# Patient Record
Sex: Female | Born: 1965 | Race: Black or African American | Hispanic: No | Smoking: Never smoker
Health system: Southern US, Community
[De-identification: ages and names within clinical notes are randomized; demographics above are authoritative.]

## PROBLEM LIST (undated history)

## (undated) DIAGNOSIS — I10 Essential (primary) hypertension: Secondary | ICD-10-CM

## (undated) HISTORY — DX: Essential (primary) hypertension: I10

---

## 2004-10-28 ENCOUNTER — Emergency Department (HOSPITAL_COMMUNITY): Admission: EM | Admit: 2004-10-28 | Discharge: 2004-10-28 | Payer: Self-pay | Admitting: Family Medicine

## 2004-12-15 ENCOUNTER — Emergency Department (HOSPITAL_COMMUNITY): Admission: EM | Admit: 2004-12-15 | Discharge: 2004-12-15 | Payer: Self-pay | Admitting: Family Medicine

## 2004-12-31 ENCOUNTER — Ambulatory Visit: Payer: Self-pay | Admitting: Internal Medicine

## 2005-03-18 ENCOUNTER — Other Ambulatory Visit: Admission: RE | Admit: 2005-03-18 | Discharge: 2005-03-18 | Payer: Self-pay | Admitting: Family Medicine

## 2005-03-18 ENCOUNTER — Ambulatory Visit: Payer: Self-pay | Admitting: Family Medicine

## 2005-06-14 ENCOUNTER — Emergency Department (HOSPITAL_COMMUNITY): Admission: EM | Admit: 2005-06-14 | Discharge: 2005-06-14 | Payer: Self-pay | Admitting: Family Medicine

## 2006-01-27 ENCOUNTER — Other Ambulatory Visit: Admission: RE | Admit: 2006-01-27 | Discharge: 2006-01-27 | Payer: Self-pay | Admitting: Obstetrics and Gynecology

## 2006-08-10 ENCOUNTER — Emergency Department (HOSPITAL_COMMUNITY): Admission: EM | Admit: 2006-08-10 | Discharge: 2006-08-11 | Payer: Self-pay | Admitting: Emergency Medicine

## 2006-12-09 ENCOUNTER — Emergency Department (HOSPITAL_COMMUNITY): Admission: EM | Admit: 2006-12-09 | Discharge: 2006-12-09 | Payer: Self-pay | Admitting: Family Medicine

## 2007-05-12 ENCOUNTER — Ambulatory Visit: Payer: Self-pay | Admitting: Family Medicine

## 2007-05-13 ENCOUNTER — Ambulatory Visit: Payer: Self-pay | Admitting: *Deleted

## 2007-06-29 ENCOUNTER — Telehealth (INDEPENDENT_AMBULATORY_CARE_PROVIDER_SITE_OTHER): Payer: Self-pay | Admitting: Internal Medicine

## 2007-07-14 ENCOUNTER — Encounter (INDEPENDENT_AMBULATORY_CARE_PROVIDER_SITE_OTHER): Payer: Self-pay | Admitting: *Deleted

## 2007-11-22 ENCOUNTER — Ambulatory Visit: Payer: Self-pay | Admitting: Nurse Practitioner

## 2007-11-22 DIAGNOSIS — I10 Essential (primary) hypertension: Secondary | ICD-10-CM | POA: Insufficient documentation

## 2007-11-22 DIAGNOSIS — K029 Dental caries, unspecified: Secondary | ICD-10-CM | POA: Insufficient documentation

## 2007-11-23 ENCOUNTER — Encounter (INDEPENDENT_AMBULATORY_CARE_PROVIDER_SITE_OTHER): Payer: Self-pay | Admitting: Nurse Practitioner

## 2007-12-23 ENCOUNTER — Ambulatory Visit: Payer: Self-pay | Admitting: Obstetrics & Gynecology

## 2007-12-24 ENCOUNTER — Ambulatory Visit: Payer: Self-pay | Admitting: Nurse Practitioner

## 2007-12-24 DIAGNOSIS — L259 Unspecified contact dermatitis, unspecified cause: Secondary | ICD-10-CM | POA: Insufficient documentation

## 2007-12-31 ENCOUNTER — Ambulatory Visit (HOSPITAL_COMMUNITY): Admission: RE | Admit: 2007-12-31 | Discharge: 2007-12-31 | Payer: Self-pay | Admitting: Internal Medicine

## 2008-01-13 ENCOUNTER — Encounter (INDEPENDENT_AMBULATORY_CARE_PROVIDER_SITE_OTHER): Payer: Self-pay | Admitting: Internal Medicine

## 2008-01-21 ENCOUNTER — Encounter: Admission: RE | Admit: 2008-01-21 | Discharge: 2008-01-21 | Payer: Self-pay | Admitting: Internal Medicine

## 2008-03-10 ENCOUNTER — Telehealth (INDEPENDENT_AMBULATORY_CARE_PROVIDER_SITE_OTHER): Payer: Self-pay | Admitting: Nurse Practitioner

## 2008-03-21 ENCOUNTER — Encounter (INDEPENDENT_AMBULATORY_CARE_PROVIDER_SITE_OTHER): Payer: Self-pay | Admitting: *Deleted

## 2008-03-31 ENCOUNTER — Ambulatory Visit: Payer: Self-pay | Admitting: Nurse Practitioner

## 2008-03-31 DIAGNOSIS — D649 Anemia, unspecified: Secondary | ICD-10-CM | POA: Insufficient documentation

## 2008-03-31 LAB — CONVERTED CEMR LAB
Basophils Absolute: 0 10*3/uL (ref 0.0–0.1)
Bilirubin Urine: NEGATIVE
Eosinophils Absolute: 0.2 10*3/uL (ref 0.0–0.7)
Eosinophils Relative: 3 % (ref 0–5)
GC Probe Amp, Genital: NEGATIVE
HCT: 34.3 % — ABNORMAL LOW (ref 36.0–46.0)
Hemoglobin: 11 g/dL — ABNORMAL LOW (ref 12.0–15.0)
Lymphocytes Relative: 40 % (ref 12–46)
Lymphs Abs: 1.9 10*3/uL (ref 0.7–4.0)
MCV: 89.1 fL (ref 78.0–100.0)
Monocytes Absolute: 0.4 10*3/uL (ref 0.1–1.0)
Pap Smear: NEGATIVE
Platelets: 313 10*3/uL (ref 150–400)
RDW: 13.7 % (ref 11.5–15.5)
Specific Gravity, Urine: 1.01
Urobilinogen, UA: 1
WBC Urine, dipstick: NEGATIVE

## 2008-04-01 ENCOUNTER — Encounter (INDEPENDENT_AMBULATORY_CARE_PROVIDER_SITE_OTHER): Payer: Self-pay | Admitting: Nurse Practitioner

## 2008-04-05 ENCOUNTER — Telehealth (INDEPENDENT_AMBULATORY_CARE_PROVIDER_SITE_OTHER): Payer: Self-pay | Admitting: Nurse Practitioner

## 2008-04-05 ENCOUNTER — Encounter (INDEPENDENT_AMBULATORY_CARE_PROVIDER_SITE_OTHER): Payer: Self-pay | Admitting: Nurse Practitioner

## 2008-04-06 ENCOUNTER — Telehealth (INDEPENDENT_AMBULATORY_CARE_PROVIDER_SITE_OTHER): Payer: Self-pay | Admitting: Nurse Practitioner

## 2008-05-29 ENCOUNTER — Telehealth (INDEPENDENT_AMBULATORY_CARE_PROVIDER_SITE_OTHER): Payer: Self-pay | Admitting: Nurse Practitioner

## 2008-06-30 ENCOUNTER — Ambulatory Visit: Payer: Self-pay | Admitting: Nurse Practitioner

## 2008-06-30 LAB — CONVERTED CEMR LAB
Basophils Absolute: 0 10*3/uL (ref 0.0–0.1)
Basophils Relative: 0 % (ref 0–1)
Eosinophils Relative: 2 % (ref 0–5)
HCT: 35.8 % — ABNORMAL LOW (ref 36.0–46.0)
Hemoglobin: 11.4 g/dL — ABNORMAL LOW (ref 12.0–15.0)
MCHC: 31.8 g/dL (ref 30.0–36.0)
Monocytes Absolute: 0.4 10*3/uL (ref 0.1–1.0)
Platelets: 223 10*3/uL (ref 150–400)
RDW: 13.9 % (ref 11.5–15.5)

## 2008-07-04 ENCOUNTER — Encounter (INDEPENDENT_AMBULATORY_CARE_PROVIDER_SITE_OTHER): Payer: Self-pay | Admitting: Nurse Practitioner

## 2008-09-15 ENCOUNTER — Ambulatory Visit: Payer: Self-pay | Admitting: Nurse Practitioner

## 2008-09-15 DIAGNOSIS — R599 Enlarged lymph nodes, unspecified: Secondary | ICD-10-CM | POA: Insufficient documentation

## 2009-05-02 ENCOUNTER — Encounter (INDEPENDENT_AMBULATORY_CARE_PROVIDER_SITE_OTHER): Payer: Self-pay | Admitting: Nurse Practitioner

## 2009-05-02 ENCOUNTER — Ambulatory Visit: Payer: Self-pay | Admitting: Nurse Practitioner

## 2009-05-02 LAB — CONVERTED CEMR LAB
Glucose, Urine, Semiquant: NEGATIVE
Nitrite: NEGATIVE
Specific Gravity, Urine: <1.005
WBC Urine, dipstick: NEGATIVE
pH: 6

## 2009-05-03 ENCOUNTER — Encounter (INDEPENDENT_AMBULATORY_CARE_PROVIDER_SITE_OTHER): Payer: Self-pay | Admitting: Nurse Practitioner

## 2009-05-03 DIAGNOSIS — E0590 Thyrotoxicosis, unspecified without thyrotoxic crisis or storm: Secondary | ICD-10-CM | POA: Insufficient documentation

## 2009-05-03 LAB — CONVERTED CEMR LAB
Albumin: 4.4 g/dL (ref 3.5–5.2)
Alkaline Phosphatase: 51 units/L (ref 39–117)
BUN: 15 mg/dL (ref 6–23)
Basophils Absolute: 0 10*3/uL (ref 0.0–0.1)
Basophils Relative: 0 % (ref 0–1)
Calcium: 9.8 mg/dL (ref 8.4–10.5)
Creatinine, Ser: 0.93 mg/dL (ref 0.40–1.20)
Eosinophils Absolute: 0.1 10*3/uL (ref 0.0–0.7)
Eosinophils Relative: 3 % (ref 0–5)
Free T4: 0.99 ng/dL (ref 0.80–1.80)
Glucose, Bld: 105 mg/dL — ABNORMAL HIGH (ref 70–99)
HCT: 34.8 % — ABNORMAL LOW (ref 36.0–46.0)
HDL: 53 mg/dL (ref >39)
Hemoglobin: 11.4 g/dL — ABNORMAL LOW (ref 12.0–15.0)
LDL Cholesterol: 71 mg/dL (ref 0–99)
Lymphocytes Relative: 37 % (ref 12–46)
MCHC: 32.8 g/dL (ref 30.0–36.0)
MCV: 93 fL (ref 78.0–100.0)
Monocytes Absolute: 0.3 10*3/uL (ref 0.1–1.0)
Potassium: 3.9 meq/L (ref 3.5–5.3)
RDW: 13 % (ref 11.5–15.5)
T3, Free: 2.5 pg/mL (ref 2.3–4.2)
Triglycerides: 30 mg/dL (ref <150)

## 2009-05-07 ENCOUNTER — Ambulatory Visit (HOSPITAL_COMMUNITY): Admission: RE | Admit: 2009-05-07 | Discharge: 2009-05-07 | Payer: Self-pay | Admitting: Internal Medicine

## 2009-05-17 ENCOUNTER — Telehealth (INDEPENDENT_AMBULATORY_CARE_PROVIDER_SITE_OTHER): Payer: Self-pay | Admitting: *Deleted

## 2009-05-17 ENCOUNTER — Ambulatory Visit: Payer: Self-pay | Admitting: Nurse Practitioner

## 2009-05-31 ENCOUNTER — Ambulatory Visit: Payer: Self-pay | Admitting: Nurse Practitioner

## 2009-11-01 ENCOUNTER — Telehealth (INDEPENDENT_AMBULATORY_CARE_PROVIDER_SITE_OTHER): Payer: Self-pay | Admitting: Nurse Practitioner

## 2009-12-21 ENCOUNTER — Ambulatory Visit: Payer: Self-pay | Admitting: Nurse Practitioner

## 2010-01-16 ENCOUNTER — Ambulatory Visit: Payer: Self-pay | Admitting: Nurse Practitioner

## 2010-02-27 ENCOUNTER — Ambulatory Visit: Payer: Self-pay | Admitting: Nurse Practitioner

## 2010-02-27 DIAGNOSIS — N898 Other specified noninflammatory disorders of vagina: Secondary | ICD-10-CM | POA: Insufficient documentation

## 2010-02-27 LAB — CONVERTED CEMR LAB
Bilirubin Urine: NEGATIVE
KOH Prep: NEGATIVE
Ketones, urine, test strip: NEGATIVE
Specific Gravity, Urine: 1.02

## 2010-06-06 ENCOUNTER — Encounter (INDEPENDENT_AMBULATORY_CARE_PROVIDER_SITE_OTHER): Payer: Self-pay | Admitting: Nurse Practitioner

## 2010-06-10 ENCOUNTER — Encounter (INDEPENDENT_AMBULATORY_CARE_PROVIDER_SITE_OTHER): Payer: Self-pay | Admitting: Nurse Practitioner

## 2010-06-12 ENCOUNTER — Encounter (INDEPENDENT_AMBULATORY_CARE_PROVIDER_SITE_OTHER): Payer: Self-pay | Admitting: Nurse Practitioner

## 2010-07-26 ENCOUNTER — Encounter: Admission: RE | Admit: 2010-07-26 | Discharge: 2010-07-26 | Payer: Self-pay | Admitting: Internal Medicine

## 2010-10-02 ENCOUNTER — Ambulatory Visit: Payer: Self-pay | Admitting: Nurse Practitioner

## 2010-11-17 ENCOUNTER — Encounter: Payer: Self-pay | Admitting: Internal Medicine

## 2010-11-24 LAB — CONVERTED CEMR LAB
ALT: 9 units/L (ref 0–35)
AST: 16 units/L (ref 0–37)
Albumin: 4.1 g/dL (ref 3.5–5.2)
Alkaline Phosphatase: 54 units/L (ref 39–117)
Basophils Absolute: 0 10*3/uL (ref 0.0–0.1)
Eosinophils Absolute: 0 10*3/uL (ref 0.0–0.7)
Eosinophils Relative: 0 % (ref 0–5)
Glucose, Bld: 87 mg/dL (ref 70–99)
LDL Cholesterol: 70 mg/dL (ref 0–99)
Lymphs Abs: 1.7 10*3/uL (ref 0.7–4.0)
MCV: 91 fL (ref 78.0–100.0)
Neutrophils Relative %: 68 % (ref 43–77)
Pap Smear: ABNORMAL
Platelets: 296 10*3/uL (ref 150–400)
Potassium: 4.5 meq/L (ref 3.5–5.3)
RDW: 13.6 % (ref 11.5–15.5)
Sodium: 143 meq/L (ref 135–145)
Specific Gravity, Urine: 1.02
TSH: 0.405 microintl units/mL (ref 0.350–5.50)
Total Bilirubin: 0.5 mg/dL (ref 0.3–1.2)
Total Protein: 6.8 g/dL (ref 6.0–8.3)
Triglycerides: 32 mg/dL (ref <150)
VLDL: 6 mg/dL (ref 0–40)
WBC: 6.9 10*3/uL (ref 4.0–10.5)

## 2010-11-26 NOTE — Assessment & Plan Note (Signed)
Summary: HTN   Vital Signs:  Patient profile:   45 year old female Menstrual status:  regular LMP:     12/17/2009 Weight:      160.7 pounds BMI:     27.68 Temp:     98.4 degrees F oral Pulse rate:   66 / minute Pulse rhythm:   regular Resp:     17 per minute BP sitting:   159 / 87  (left arm) Cuff size:   regular  Vitals Entered By: Geanie Cooley  (December 21, 2009 11:40 AM) CC: Pt here for bp check, pt states her blood pressure has been running high. 0n2/23/10 it was 157/97, on 12/14/09 it was 157/99. Pt states when her blood pressure gets too high and she start seeing stars and her head begins to hurt she will take an old prescription on Lisinopril HCTZ 10-12.5 tab.  Pt states her menstrual cycle keeps monving up earlier and earlier and she just wanted to make sure tha ts common., Hypertension Management Pain Assessment Patient in pain? no       Does patient need assistance? Functional Status Self care Ambulation Normal LMP (date): 12/17/2009 LMP - Character: normal    Menses interval (days): 30 Menstrual flow (days): 4-5 Menstrual Status regular Enter LMP: 12/17/2009 Last PAP Result  Specimen Adequacy: Satisfactory for evaluation.   Interpretation/Result:Negative for intraepithelial Lesion or Malignancy.   Interpretation/Result:Reactive Changes.     CC:  Pt here for bp check, pt states her blood pressure has been running high. 0n2/23/10 it was 157/97, on 12/14/09 it was 157/99. Pt states when her blood pressure gets too high and she start seeing stars and her head begins to hurt she will take an old prescription on Lisinopril HCTZ 10-12.5 tab.  Pt states her menstrual cycle keeps monving up earlier and earlier and she just wanted to make sure tha ts common., and Hypertension Management.  History of Present Illness:  Pt into the office with complaints of elevated blood pressure pt was weaned off her blood pressure meds on last visit due to weight loss and  exercising.  Obesity - weight gain of 14 pounds since the last visit.  She admited that she has decreased on her exercise given her elevated work load at school.   Pt thinks that she has black mold in her apartment.  She is complaining of some abdominal itching.  No rash noted although pt does have burns in the area of concern.   Hypertension History:      She denies headache, chest pain, and palpitations.  She notes no problems with any antihypertensive medication side effects.  Pt was weaned off her bp meds.  She recently restarted some of her old meds that she had from a previous Rx.        Positive major cardiovascular risk factors include hypertension.  Negative major cardiovascular risk factors include female age less than 62 years old and non-tobacco-user status.        Further assessment for target organ damage reveals no history of ASHD, cardiac end-organ damage (CHF/LVH), stroke/TIA, peripheral vascular disease, renal insufficiency, or hypertensive retinopathy.     Habits & Providers  Alcohol-Tobacco-Diet     Alcohol drinks/day: 0     Tobacco Status: never  Exercise-Depression-Behavior     Does Patient Exercise: yes     Exercise Counseling: to improve exercise regimen     Type of exercise: cardio, resistance training     Drug Use: no  Seat Belt Use: 100     Sun Exposure: occasionally  Comments: Exercise regimen has decreased due to school load  Allergies (verified): No Known Drug Allergies  Review of Systems General:  Denies fever. CV:  Denies chest pain or discomfort. Resp:  Denies cough. GI:  Denies abdominal pain, nausea, and vomiting.  Physical Exam  General:  alert.   Head:  normocephalic.   Lungs:  normal breath sounds.   Heart:  normal rate and regular rhythm.   Abdomen:  normal bowel sounds.   Msk:  normal ROM.   Neurologic:  alert & oriented X3.     Impression & Recommendations:  Problem # 1:  ESSENTIAL HYPERTENSION, BENIGN (ICD-401.1) DASH  diet pt will need to restart medication Her updated medication list for this problem includes:    Lisinopril-hydrochlorothiazide 20-12.5 Mg Tabs (Lisinopril-hydrochlorothiazide) ..... One tablet by mouth daily for blood pressure  Complete Medication List: 1)  Ferrous Sulfate 325 (65 Fe) Mg Tbec (Ferrous sulfate) .Marland Kitchen.. 1 tablet by mouth by mouth two times a day 2)  Lisinopril-hydrochlorothiazide 20-12.5 Mg Tabs (Lisinopril-hydrochlorothiazide) .... One tablet by mouth daily for blood pressure  Hypertension Assessment/Plan:      The patient's hypertensive risk group is category A: No risk factors and no target organ damage.  Her calculated 10 year risk of coronary heart disease is 2 %.  Today's blood pressure is 159/87.  Her blood pressure goal is < 140/90.  Patient Instructions: 1)  You will need to restart your blood pressure medications.  2)  You will need to decrease the salt in your diet. 3)  Follow up in this office in 3-4 weeks for a blood pressure check. (nurse visit) 4)  Continue to check every other day at home using your machine. Prescriptions: LISINOPRIL-HYDROCHLOROTHIAZIDE 20-12.5 MG TABS (LISINOPRIL-HYDROCHLOROTHIAZIDE) One tablet by mouth daily for blood pressure  #30 x 5   Entered and Authorized by:   Lehman Prom FNP   Signed by:   Lehman Prom FNP on 12/21/2009   Method used:   Print then Give to Patient   RxID:   [id number]    Vital Signs:  Patient profile:   45 year old female Menstrual status:  regular LMP:     12/17/2009 Weight:      160.7 pounds BMI:     27.68 Temp:     98.4 degrees F oral Pulse rate:   66 / minute Pulse rhythm:   regular Resp:     17 per minute BP sitting:   159 / 87  (left arm) Cuff size:   regular  Vitals Entered By: Geanie Cooley  (December 21, 2009 11:40 AM)

## 2010-11-26 NOTE — Progress Notes (Signed)
   Phone Note Call from Patient Call back at [facility] Phone [phone]   Summary of Call: The pt wants to get a refills from her bp machine that broke around two months ago.  [facility] or [facility]).  Please call her back. [facility] FNP Initial call taken by: Manon Hilding,  November 01, 2009 10:44 AM  Follow-up for Phone Call        left message to return call.Marland KitchenMarland KitchenMikey College CMA  November 01, 2009 3:55 PM   Additional Follow-up for Phone Call Additional follow up Details #1::        PATIENT CALLED BACK TO SEE IF WE WROTE RX FOR BP MACHINES, AND SHE WAS TOLD THAT SHE HAS TO PURCHASE IT ON HER OWN. Additional Follow-up by: Leodis Rains,  November 01, 2009 4:30 PM

## 2010-11-26 NOTE — Assessment & Plan Note (Signed)
Summary: Vaginal discharge   Vital Signs:  Patient profile:   45 year old female Menstrual status:  regular LMP:     01/29/2010 Weight:      159.8 pounds BMI:     27.53 BSA:     1.78 Temp:     98.3 degrees F oral Pulse rate:   54 / minute Pulse rhythm:   regular Resp:     16 per minute BP sitting:   95 / 63  (left arm) Cuff size:   regular  Vitals Entered By: Levon Hedger (Feb 27, 2010 10:56 AM) CC: vaginal itch x 2 weeks Is Patient Diabetic? No Pain Assessment Patient in pain? no       Does patient need assistance? Functional Status Self care Ambulation Normal LMP (date): 01/29/2010 LMP - Character: normal    Menses interval (days): 30 Menstrual flow (days): 4-5 Enter LMP: 01/29/2010 Last PAP Result  Specimen Adequacy: Satisfactory for evaluation.   Interpretation/Result:Negative for intraepithelial Lesion or Malignancy.   Interpretation/Result:Reactive Changes.     CC:  vaginal itch x 2 weeks.  History of Present Illness:  Pt into the office to check for yeast infection Pt admits that she took a class during which she received information about yeast infection and she wanted to be sure that she did not have an infection -discharge +vaginal itching +tingling -dysuria -hematuria +menses montly on last on April 5th No hx of yeast infections and BV no change in sexual partners -   Allergies (verified): No Known Drug Allergies  Review of Systems CV:  Denies chest pain or discomfort. Resp:  Denies cough. GI:  Denies abdominal pain, nausea, and vomiting. GU:  Denies dysuria and hematuria; vaginal itching.  Physical Exam  General:  alert.   Head:  normocephalic.   Genitalia:  self wet prep Msk:  normal ROM.   Neurologic:  alert & oriented X3.   Psych:  Oriented X3.     Impression & Recommendations:  Problem # 1:  VAGINAL DISCHARGE (ICD-623.5) stable no signs of yeast or infection Orders: KOH/ WET Mount [phone]) UA Dipstick w/o Micro  (manual) ([id number])  Problem # 2:  ESSENTIAL HYPERTENSION, BENIGN (ICD-401.1)  Her updated medication list for this problem includes:    Lisinopril-hydrochlorothiazide 20-12.5 Mg Tabs (Lisinopril-hydrochlorothiazide) ..... One tablet by mouth daily for blood pressure  Complete Medication List: 1)  Ferrous Sulfate 325 (65 Fe) Mg Tbec (Ferrous sulfate) .Marland Kitchen.. 1 tablet by mouth by mouth two times a day 2)  Lisinopril-hydrochlorothiazide 20-12.5 Mg Tabs (Lisinopril-hydrochlorothiazide) .... One tablet by mouth daily for blood pressure  Patient Instructions: 1)  Follow up as needed  Laboratory Results   Urine Tests  Date/Time Received: Feb 27, 2010 11:23 AM  Date/Time Reported: Feb 27, 2010 11:23 AM   Routine Urinalysis   Color: lt. yellow Appearance: Clear Glucose: negative   (Normal Range: Negative) Bilirubin: negative   (Normal Range: Negative) Ketone: negative   (Normal Range: Negative) Spec. Gravity: 1.020   (Normal Range: 1.003-1.035) Blood: small   (Normal Range: Negative) pH: 5.0   (Normal Range: 5.0-8.0) Protein: negative   (Normal Range: Negative) Urobilinogen: 0.2   (Normal Range: 0-1) Nitrite: negative   (Normal Range: Negative) Leukocyte Esterace: negative   (Normal Range: Negative)    Date/Time Received: Feb 27, 2010 11:44 AM   Wet Mount/KOH Source: vaginal WBC/hpf: 1-5 Bacteria/hpf: rare Clue cells/hpf: none Yeast/hpf: none Trichomonas/hpf: none      Laboratory Results   Urine Tests  Routine Urinalysis   Color: lt. yellow Appearance: Clear Glucose: negative   (Normal Range: Negative) Bilirubin: negative   (Normal Range: Negative) Ketone: negative   (Normal Range: Negative) Spec. Gravity: 1.020   (Normal Range: 1.003-1.035) Blood: small   (Normal Range: Negative) pH: 5.0   (Normal Range: 5.0-8.0) Protein: negative   (Normal Range: Negative) Urobilinogen: 0.2   (Normal Range: 0-1) Nitrite: negative   (Normal Range: Negative) Leukocyte  Esterace: negative   (Normal Range: Negative)      Wet Mount Wet Mount KOH: Negative

## 2011-05-19 ENCOUNTER — Inpatient Hospital Stay (INDEPENDENT_AMBULATORY_CARE_PROVIDER_SITE_OTHER)
Admission: RE | Admit: 2011-05-19 | Discharge: 2011-05-19 | Disposition: A | Discharge status: Home / Self Care | Payer: PRIVATE HEALTH INSURANCE | Source: Ambulatory Visit | Attending: Emergency Medicine | Admitting: Emergency Medicine

## 2011-05-19 DIAGNOSIS — B009 Herpesviral infection, unspecified: Secondary | ICD-10-CM

## 2011-05-21 LAB — HERPES SIMPLEX VIRUS CULTURE: Culture: DETECTED

## 2011-09-30 ENCOUNTER — Other Ambulatory Visit: Payer: Self-pay | Admitting: Family Medicine

## 2011-09-30 DIAGNOSIS — Z1231 Encounter for screening mammogram for malignant neoplasm of breast: Secondary | ICD-10-CM

## 2011-11-05 ENCOUNTER — Ambulatory Visit
Admission: RE | Admit: 2011-11-05 | Discharge: 2011-11-05 | Disposition: A | Discharge status: Home / Self Care | Payer: PRIVATE HEALTH INSURANCE | Source: Ambulatory Visit | Attending: Family Medicine | Admitting: Family Medicine

## 2011-11-05 DIAGNOSIS — Z1231 Encounter for screening mammogram for malignant neoplasm of breast: Secondary | ICD-10-CM

## 2012-02-20 ENCOUNTER — Other Ambulatory Visit: Payer: Self-pay | Admitting: Emergency Medicine

## 2012-02-20 DIAGNOSIS — E041 Nontoxic single thyroid nodule: Secondary | ICD-10-CM

## 2012-02-21 LAB — BASIC METABOLIC PANEL: Sodium: 141 mmol/L (ref 137–147)

## 2012-02-21 LAB — HEPATIC FUNCTION PANEL
ALT: 8 U/L (ref 7–35)
AST: 18 U/L (ref 13–35)
Alkaline Phosphatase: 66 U/L (ref 25–125)
Bilirubin, Total: 0.5 mg/dL

## 2012-02-24 ENCOUNTER — Ambulatory Visit
Admission: RE | Admit: 2012-02-24 | Discharge: 2012-02-24 | Disposition: A | Discharge status: Home / Self Care | Payer: PRIVATE HEALTH INSURANCE | Source: Ambulatory Visit | Attending: Emergency Medicine | Admitting: Emergency Medicine

## 2012-02-24 DIAGNOSIS — E041 Nontoxic single thyroid nodule: Secondary | ICD-10-CM

## 2012-03-19 ENCOUNTER — Encounter (INDEPENDENT_AMBULATORY_CARE_PROVIDER_SITE_OTHER): Payer: Self-pay | Admitting: General Surgery

## 2012-03-19 ENCOUNTER — Ambulatory Visit (INDEPENDENT_AMBULATORY_CARE_PROVIDER_SITE_OTHER): Payer: PRIVATE HEALTH INSURANCE | Admitting: General Surgery

## 2012-03-19 ENCOUNTER — Ambulatory Visit (INDEPENDENT_AMBULATORY_CARE_PROVIDER_SITE_OTHER): Payer: Self-pay | Admitting: General Surgery

## 2012-03-19 ENCOUNTER — Other Ambulatory Visit (INDEPENDENT_AMBULATORY_CARE_PROVIDER_SITE_OTHER): Payer: Self-pay

## 2012-03-19 VITALS — BP 122/88 | HR 58 | Temp 97.3°F | Resp 14 | Ht 62.0 in | Wt 169.8 lb

## 2012-03-19 DIAGNOSIS — E042 Nontoxic multinodular goiter: Secondary | ICD-10-CM | POA: Insufficient documentation

## 2012-03-19 NOTE — Progress Notes (Signed)
Addended by: Joanette Gula on: 03/19/2012 04:36 PM   Modules accepted: Orders

## 2012-03-19 NOTE — Progress Notes (Signed)
Patient ID: Patricia Patterson, female   DOB: September 05, 1966, 46 y.o.   MRN: [id number]  Chief Complaint  Patient presents with  . New Evaluation    Multiple Thyroid nodules    HPI  Patricia Patterson is a 46 y.o. female.  She is referred to me for evaluation of thyroid nodules by Lehman Prom, nurse practitioner at [facility], [facility], [employer].  The patient presented for evaluation of fatigue. She was found to have iron deficiency anemia and has been started on iron. Thyroid function tests are normal his TSH 0.367 and free T4 0.96. A thyroid nodule was felt. Ultrasound shows multiple nodules of varying sizes. The largest simple cyst was 2.7 cm in the right upper pole. The largest partially solid nodule was 2.3 cm in the right isthmus. Multiple small nodules were seen.  She has well-controlled hypertension, on HCTZ, history C-section, anemia. She is a social work Consulting civil engineer. HPI  Past Medical History  Diagnosis Date  . Hypertension     History reviewed. No pertinent past surgical history.  History reviewed. No pertinent family history.  Social History History  Substance Use Topics  . Smoking status: Never Smoker   . Smokeless tobacco: Never Used  . Alcohol Use: No    No Known Allergies  Current Outpatient Prescriptions  Medication Sig Dispense Refill  . ferrous gluconate (FERGON) 325 MG tablet Take 325 mg by mouth daily with breakfast.      . lisinopril-hydrochlorothiazide (PRINZIDE,ZESTORETIC) 20-12.5 MG per tablet Take 1 tablet by mouth daily.        Review of Systems Review of Systems  Constitutional: Negative for fever, chills and unexpected weight change.  HENT: Negative for hearing loss, congestion, sore throat, trouble swallowing, neck pain, neck stiffness and voice change.   Eyes: Negative for photophobia and visual disturbance.  Respiratory: Negative for cough and wheezing.   Cardiovascular: Negative for chest pain, palpitations and leg  swelling.  Gastrointestinal: Negative for nausea, vomiting, abdominal pain, diarrhea, constipation, blood in stool, abdominal distention and anal bleeding.  Genitourinary: Negative for hematuria, vaginal bleeding and difficulty urinating.  Musculoskeletal: Negative for arthralgias.  Skin: Negative for rash and wound.  Neurological: Negative for seizures, syncope and headaches.  Hematological: Negative for adenopathy. Does not bruise/bleed easily.  Psychiatric/Behavioral: Negative for confusion.    Blood pressure 122/88, pulse 58, temperature 97.3 F (36.3 C), temperature source Temporal, resp. rate 14, height 5\' 2"  (1.575 m), weight 169 lb 12.8 oz (77.021 kg).  Physical Exam Physical Exam  Constitutional: She is oriented to person, place, and time. She appears well-developed and well-nourished. No distress.  HENT:  Head: Normocephalic and atraumatic.  Nose: Nose normal.  Mouth/Throat: No oropharyngeal exudate.  Eyes: Conjunctivae and EOM are normal. Pupils are equal, round, and reactive to light. Left eye exhibits no discharge. No scleral icterus.  Neck: Neck supple. No JVD present. No tracheal deviation present. No thyromegaly present.       1 cm firm, smooth round nodule right thyroid anteriorly. No other masses or adenopathy felt.  Cardiovascular: Normal rate, regular rhythm, normal heart sounds and intact distal pulses.   No murmur heard. Pulmonary/Chest: Effort normal and breath sounds normal. No respiratory distress. She has no wheezes. She has no rales. She exhibits no tenderness.  Abdominal: Soft. Bowel sounds are normal. She exhibits no distension and no mass. There is no tenderness. There is no rebound and no guarding.       c section scar  Musculoskeletal: She exhibits no edema and no tenderness.  Lymphadenopathy:    She has no cervical adenopathy.  Neurological: She is alert and oriented to person, place, and time. She exhibits normal muscle tone. Coordination normal.    Skin: Skin is warm. No rash noted. She is not diaphoretic. No erythema. No pallor.  Psychiatric: She has a normal mood and affect. Her behavior is normal. Judgment and thought content normal.    Data Reviewed Notes and labs and ultrasound reviewed  Assessment    Right thyroid nodule, 2.3 cm, FNA indicated  Current deficiency anemia  Euthyroid.  Hypertension     Plan    I discussed the differential diagnosis with the patient. I gave her patient information booklets about thyroid disease.  She will be scheduled for ultrasound-guided FNA of the right thyroid nodule  She will return to see me after this is done to discuss the findings. She is aware that if there is atypia or malignancy that she may need an operation. Hopefully this will be a benign multinodular, nontoxic goiter.       Angelia Mould. Derrell Lolling, M.D., [facility] General and Minimally invasive Surgery Breast and Colorectal Surgery Office:   [phone] Pager:   [phone]  03/19/2012, 4:21 PM

## 2012-03-19 NOTE — Patient Instructions (Signed)
You have multiple nodules of different sizes in your thyroid gland, and your thyroid hormone levels are basically normal. 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  One of the partially solid nodules on the right side is 2.3 cm in size, and that is large enough to warrant a needle biopsy.  We are going to schedule you for a fine needle aspiration biopsy of your thyroid nodule in the radiology department in the near future.  Return to see Dr. Derrell Lolling in 2-4 weeks to discuss these findings once the test is done.

## 2012-03-30 ENCOUNTER — Other Ambulatory Visit (HOSPITAL_COMMUNITY)
Admission: RE | Admit: 2012-03-30 | Discharge: 2012-03-30 | Disposition: A | Discharge status: Home / Self Care | Payer: PRIVATE HEALTH INSURANCE | Source: Ambulatory Visit | Attending: Interventional Radiology | Admitting: Interventional Radiology

## 2012-03-30 ENCOUNTER — Ambulatory Visit
Admission: RE | Admit: 2012-03-30 | Discharge: 2012-03-30 | Disposition: A | Discharge status: Home / Self Care | Payer: PRIVATE HEALTH INSURANCE | Source: Ambulatory Visit | Attending: General Surgery | Admitting: General Surgery

## 2012-03-30 DIAGNOSIS — E0789 Other specified disorders of thyroid: Secondary | ICD-10-CM | POA: Insufficient documentation

## 2012-03-30 DIAGNOSIS — E042 Nontoxic multinodular goiter: Secondary | ICD-10-CM

## 2012-03-31 ENCOUNTER — Telehealth (INDEPENDENT_AMBULATORY_CARE_PROVIDER_SITE_OTHER): Payer: Self-pay

## 2012-03-31 NOTE — Progress Notes (Signed)
Quick Note:  Inform patient of Pathology report,. ______ 

## 2012-03-31 NOTE — Telephone Encounter (Signed)
Pt notified that path is benign. Pt wants to know what her follow up will be. If just imaging in future she prefers a phone call instead of another office visit.  I advised her I will review path with Dr Derrell Lolling to see what f/u he has planned and call her with recommendation.

## 2012-04-01 ENCOUNTER — Telehealth (INDEPENDENT_AMBULATORY_CARE_PROVIDER_SITE_OTHER): Payer: Self-pay

## 2012-04-01 DIAGNOSIS — E041 Nontoxic single thyroid nodule: Secondary | ICD-10-CM

## 2012-04-01 NOTE — Telephone Encounter (Signed)
Per Dr Derrell Lolling u/s and bx report are ok. Pt to f/u in 6 mo with repeat u/s and office exam after that. Pt advised of this and states she is fine with this plan. Order for future u/s in epic and to [facility] to put in recall for 6 mo f/u.

## 2012-11-23 ENCOUNTER — Telehealth: Payer: Self-pay | Admitting: Radiology

## 2012-11-23 NOTE — Telephone Encounter (Signed)
Patient looking for her report of TB skin test/ not in Epic, must be I/A

## 2013-01-05 ENCOUNTER — Other Ambulatory Visit (HOSPITAL_COMMUNITY): Payer: Self-pay | Admitting: Family Medicine

## 2013-01-05 DIAGNOSIS — Z1231 Encounter for screening mammogram for malignant neoplasm of breast: Secondary | ICD-10-CM

## 2013-01-14 ENCOUNTER — Ambulatory Visit (HOSPITAL_COMMUNITY)
Admission: RE | Admit: 2013-01-14 | Discharge: 2013-01-14 | Disposition: A | Discharge status: Home / Self Care | Payer: Medicaid Other | Source: Ambulatory Visit | Attending: Family Medicine | Admitting: Family Medicine

## 2013-01-14 DIAGNOSIS — Z1231 Encounter for screening mammogram for malignant neoplasm of breast: Secondary | ICD-10-CM | POA: Insufficient documentation

## 2013-03-08 ENCOUNTER — Encounter: Payer: Self-pay | Admitting: General Practice

## 2013-08-06 IMAGING — US US THYROID BIOPSY
1 series · 13 of 13 positions shown · non-contrast
Comparison: none

CLINICAL DATA: Multiple thyroid nodules.  Dominant lesion is in
the isthmus on the right..

ULTRASOUND-GUIDED THYROID ASPIRATION BIOPSY
TECHNIQUE: Survey ultrasound was performed and the dominant lesion
in the isthmus   was localized.  An appropriate skin entry site was
determined.  Skin was marked, then prepped with Betadine, draped in
usual sterile fashion, and infiltrated locally with 1% lidocaine.
Under real-time ultrasound guidance, 4  passes were made into the
lesion with 25 gauge needles.  The patient tolerated procedure
well, with no immediate complications.
IMPRESSION
1.  Technically successful ultrasound-guided thyroid aspiration
biopsy

[Series 1: us thyroid biopsy · 0.06mm/px · 13 acquisitions, 13 frames shown]
[im 1/13]
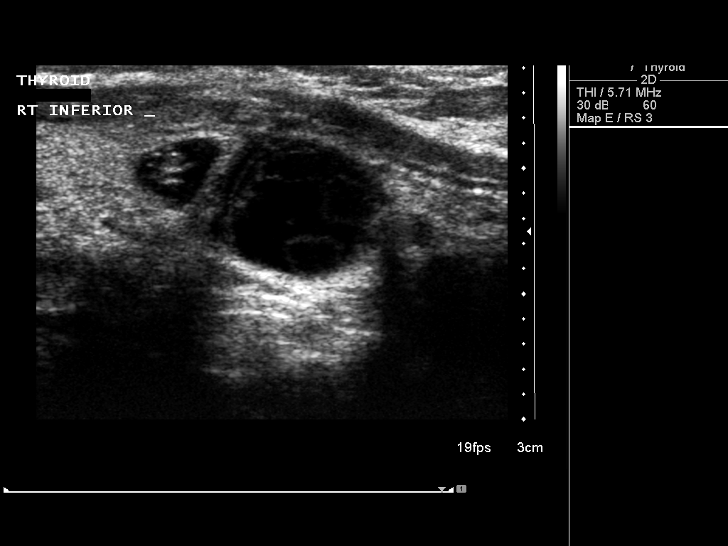
[im 2/13]
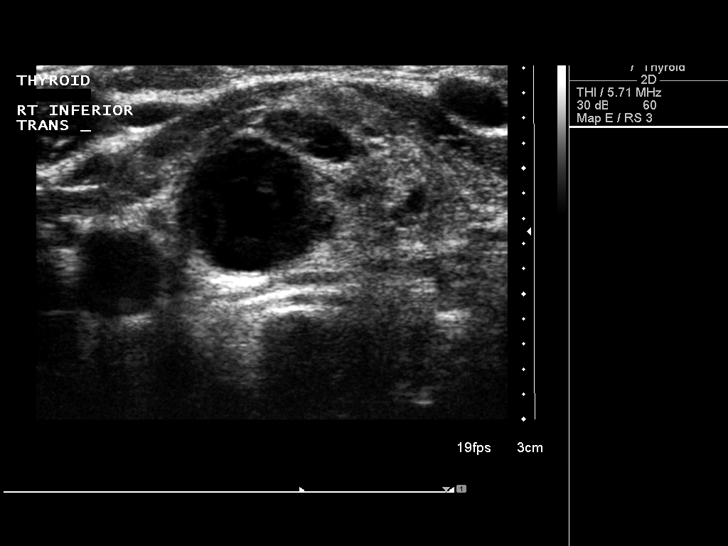
[im 3/13]
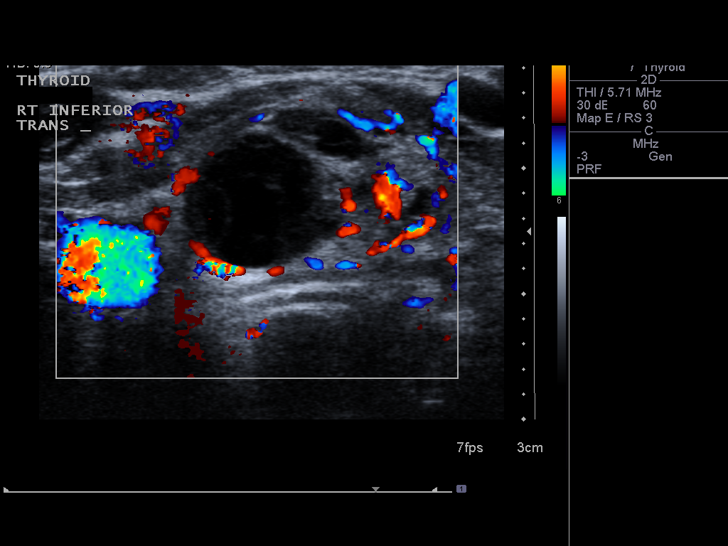
[im 4/13]
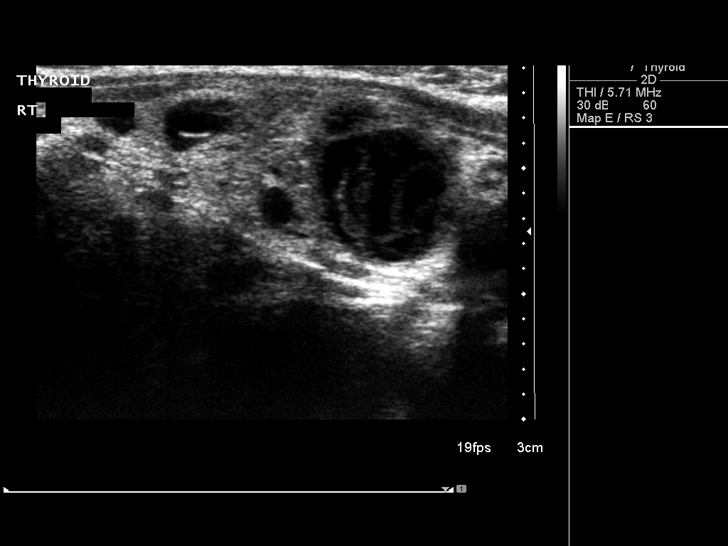
[im 5/13]
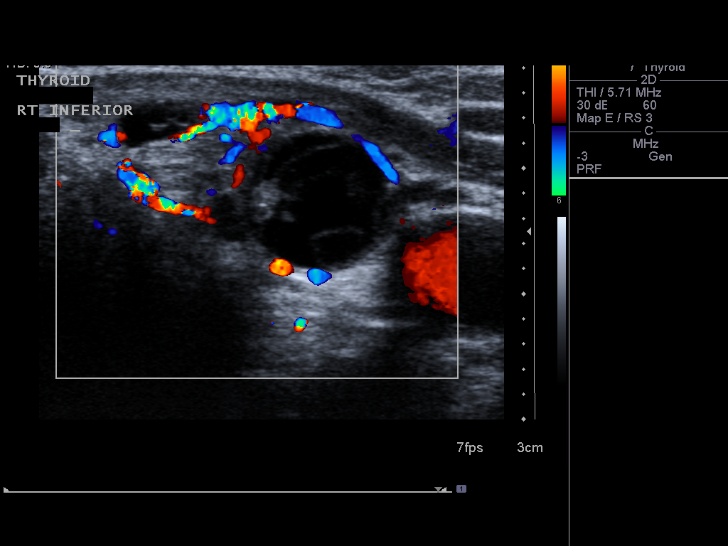
[im 6/13]
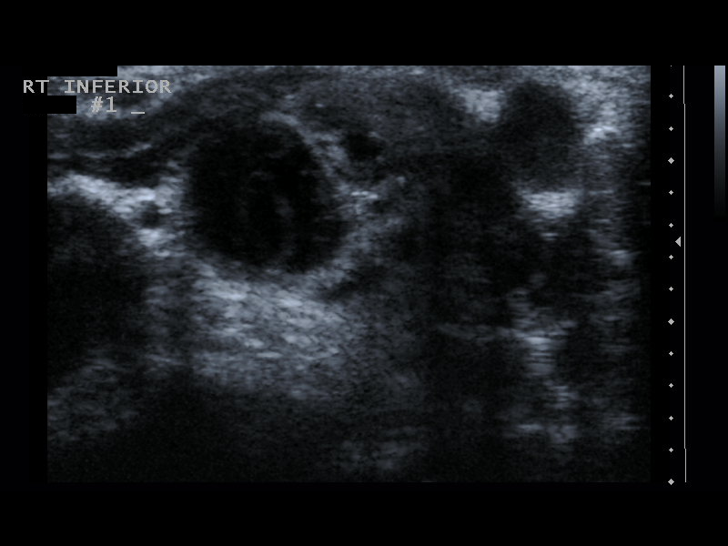
[im 7/13]
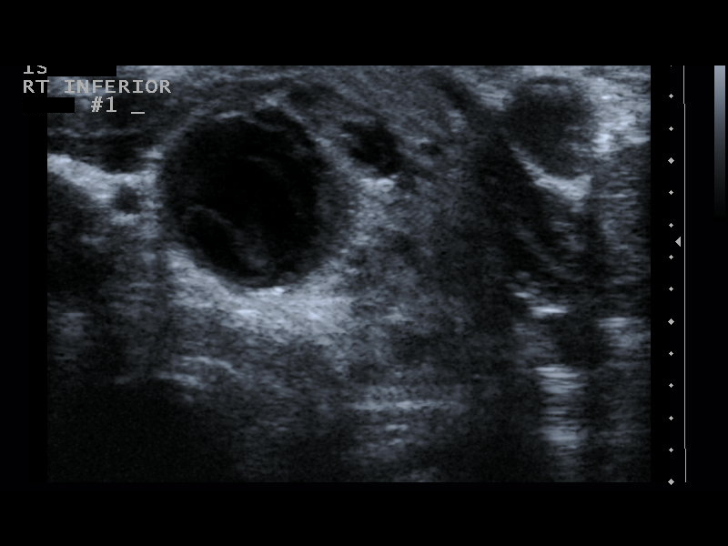
[im 8/13]
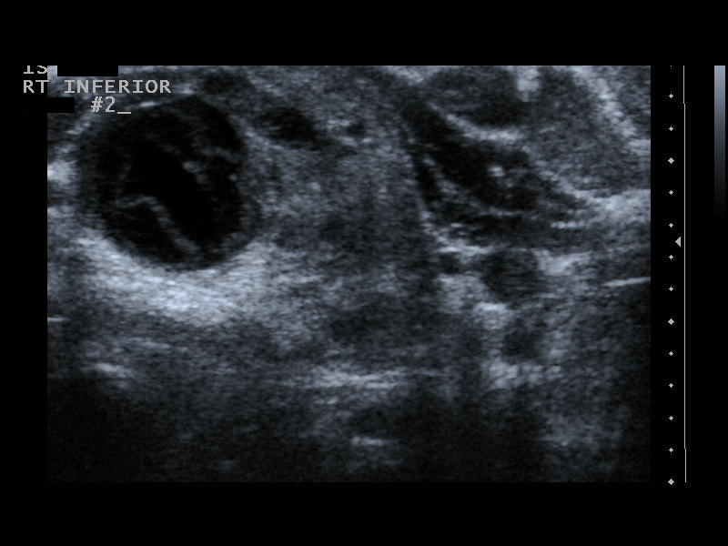
[im 9/13]
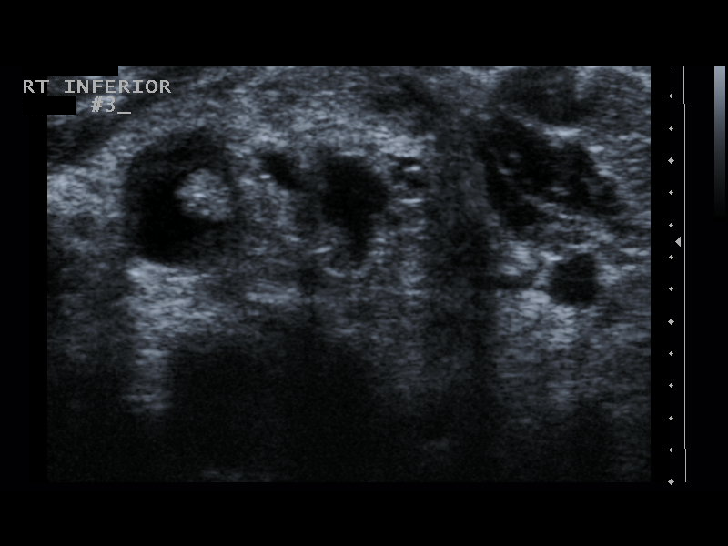
[im 10/13]
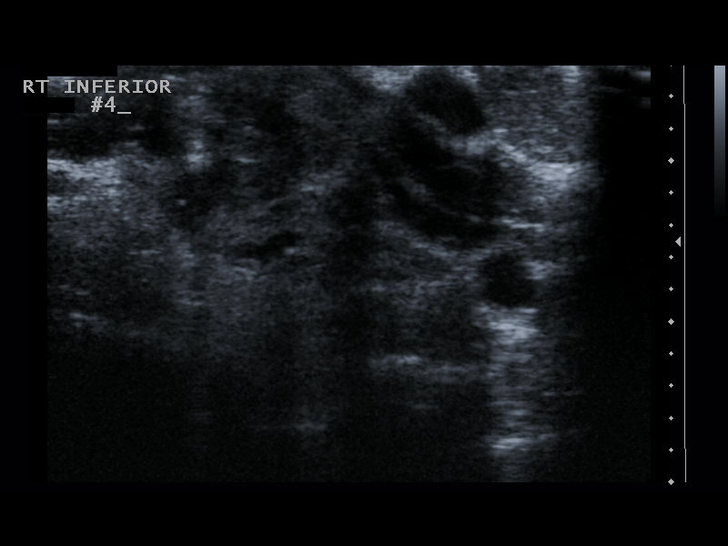
[im 11/13]
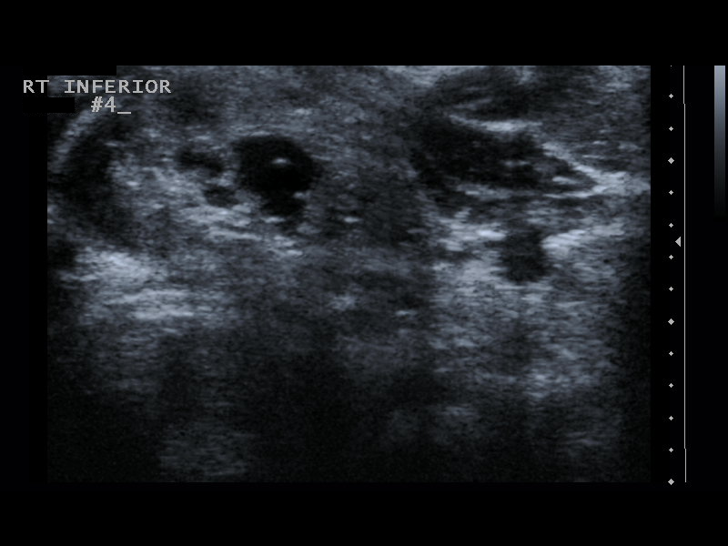
[im 12/13]
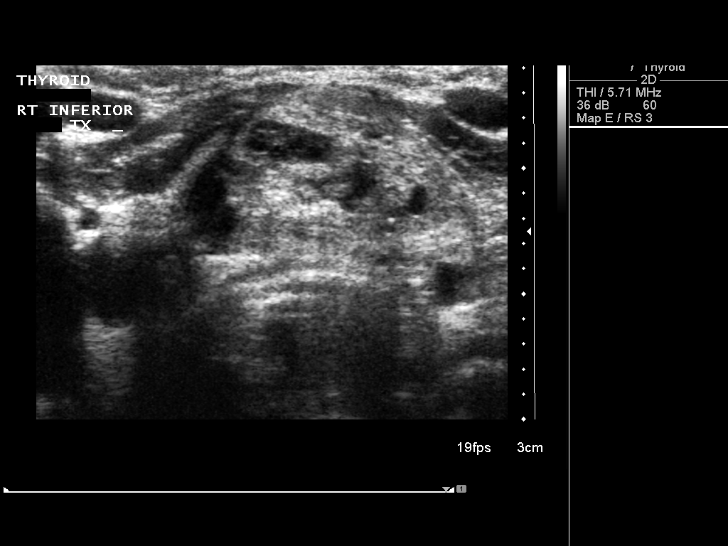
[im 13/13]
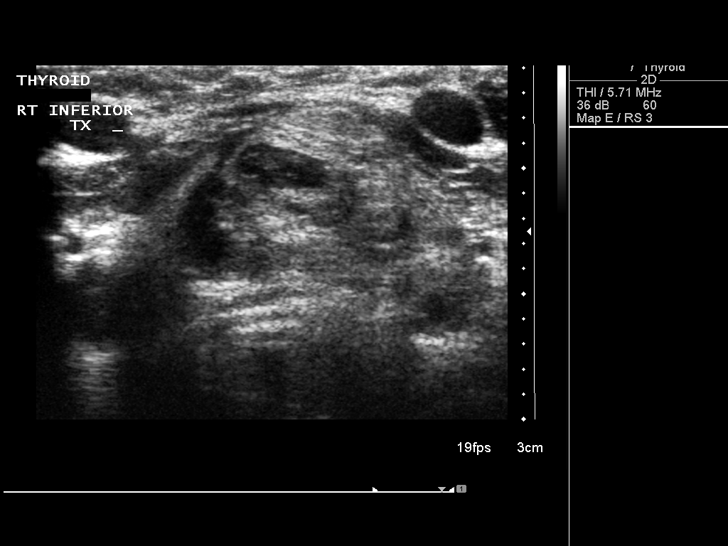

[13 of 13 positions shown; findings below may reference images not displayed]

## 2021-10-04 ENCOUNTER — Encounter: Payer: Self-pay | Admitting: Podiatry

## 2021-10-04 ENCOUNTER — Ambulatory Visit (INDEPENDENT_AMBULATORY_CARE_PROVIDER_SITE_OTHER): Payer: No Typology Code available for payment source | Admitting: Podiatry

## 2021-10-04 ENCOUNTER — Other Ambulatory Visit: Payer: Self-pay

## 2021-10-04 DIAGNOSIS — L603 Nail dystrophy: Secondary | ICD-10-CM

## 2021-10-04 NOTE — Progress Notes (Signed)
  Subjective:  Patient ID: Patricia Patterson, female    DOB: 1966/09/24,   MRN: [id number]  Chief Complaint  Patient presents with   Nail Problem     leeft foot 3rd toenail is coming off    55 y.o. female presents for concern of left foot third toenail. Relates last week that she noticed her left third digit nail was coming off after getting out of the shower. Denies any injury. Denies any treatment. Denies any pain or soreness . Denies any other pedal complaints. Denies n/v/f/c.   Past Medical History:  Diagnosis Date   Hypertension     Objective:  Physical Exam: Vascular: DP/PT pulses 2/4 bilateral. CFT <3 seconds. Normal hair growth on digits. No edema.  Skin. No lacerations or abrasions bilateral feet. Left third digit distal aspect of nail is lifted off nail bed with new clean nail underneath upon debridement. No wounds noted.  Musculoskeletal: MMT 5/5 bilateral lower extremities in DF, PF, Inversion and Eversion. Deceased ROM in DF of ankle joint.  Neurological: Sensation intact to light touch.   Assessment:   1. Onychodystrophy      Plan:  Patient was evaluated and treated and all questions answered. -Examined patient -Discussed treatment options for dystrophic nails  -Distal aspect of nail was debrided on left third digit as courtesy.  -No wounds or other concerns. Discussed if she has any redness pain or swelling to return.  Patient to return as needed.    Louann Sjogren, DPM

## 2024-06-15 ENCOUNTER — Other Ambulatory Visit: Payer: Self-pay | Admitting: Family Medicine

## 2024-06-15 DIAGNOSIS — Z1231 Encounter for screening mammogram for malignant neoplasm of breast: Secondary | ICD-10-CM
